# Patient Record
Sex: Female | Born: 1940 | Race: White | Hispanic: No | Smoking: Never smoker
Health system: Southern US, Community
[De-identification: ages and names within clinical notes are randomized; demographics above are authoritative.]

## PROBLEM LIST (undated history)

## (undated) DIAGNOSIS — F32A Depression, unspecified: Secondary | ICD-10-CM

## (undated) DIAGNOSIS — M797 Fibromyalgia: Secondary | ICD-10-CM

## (undated) DIAGNOSIS — T4145XA Adverse effect of unspecified anesthetic, initial encounter: Secondary | ICD-10-CM

## (undated) DIAGNOSIS — K219 Gastro-esophageal reflux disease without esophagitis: Secondary | ICD-10-CM

## (undated) DIAGNOSIS — F329 Major depressive disorder, single episode, unspecified: Secondary | ICD-10-CM

## (undated) DIAGNOSIS — M1990 Unspecified osteoarthritis, unspecified site: Secondary | ICD-10-CM

## (undated) DIAGNOSIS — F419 Anxiety disorder, unspecified: Secondary | ICD-10-CM

## (undated) DIAGNOSIS — T8859XA Other complications of anesthesia, initial encounter: Secondary | ICD-10-CM

## (undated) HISTORY — PX: BACK SURGERY: SHX140

## (undated) HISTORY — PX: ABDOMINAL HYSTERECTOMY: SHX81

---

## 2004-11-01 ENCOUNTER — Ambulatory Visit (HOSPITAL_COMMUNITY): Admission: RE | Admit: 2004-11-01 | Discharge: 2004-11-02 | Payer: Self-pay | Admitting: Neurosurgery

## 2018-11-27 ENCOUNTER — Other Ambulatory Visit: Payer: Self-pay | Admitting: Neurosurgery

## 2018-12-28 NOTE — Pre-Procedure Instructions (Signed)
Alison FullerMargaret Davis  12/28/2018      [facility] [address], [place] - [address], [id number] [place] [id number] Phone: [phone] Fax: [phone]    Your procedure is scheduled on Fri., Jan. 10, 2020 from 9:30AM-12:10PM  [facility] "A" at 7:30AM  Call this number if you have problems the morning of surgery:  [phone]   Remember:  Do not eat or drink after midnight on Jan. 9th    Take these medicines the morning of surgery with A SIP OF WATER: Estradiol (ESTRACE)  If needed: TraMADol (ULTRAM) and Eye drops  As of today, stop taking all Other Aspirin Products, Vitamins, Fish oils, and Herbal medications. Also stop all NSAIDS i.e. Advil, Ibuprofen, Motrin, Aleve, Anaprox, Naproxen, BC, Goody Powders, and all Supplements.    Do not wear jewelry, make-up or nail polish.  Do not wear lotions, powders, or perfumes, or deodorant.  Do not shave 48 hours prior to surgery.    Do not bring valuables to the hospital.  [facility] is not responsible for any belongings or valuables.  Contacts, dentures or bridgework may not be worn into surgery.  Leave your suitcase in the car.  After surgery it may be brought to your room.  For patients admitted to the hospital, discharge time will be determined by your treatment team.  Patients discharged the day of surgery will not be allowed to drive home.   Special instructions:   [facility]- Preparing For Surgery  Before surgery, you can play an important role. Because skin is not sterile, your skin needs to be as free of germs as possible. You can reduce the number of germs on your skin by washing with CHG (chlorahexidine gluconate) Soap before surgery.  CHG is an antiseptic cleaner which kills germs and bonds with the skin to continue killing germs even after washing.    Oral Hygiene is also important to reduce your risk of infection.  Remember - BRUSH  YOUR TEETH THE MORNING OF SURGERY WITH YOUR REGULAR TOOTHPASTE  Please do not use if you have an allergy to CHG or antibacterial soaps. If your skin becomes reddened/irritated stop using the CHG.  Do not shave (including legs and underarms) for at least 48 hours prior to first CHG shower. It is OK to shave your face.  Please follow these instructions carefully.   1. Shower the NIGHT BEFORE SURGERY and the MORNING OF SURGERY with CHG.   2. If you chose to wash your hair, wash your hair first as usual with your normal shampoo.  3. After you shampoo, rinse your hair and body thoroughly to remove the shampoo.  4. Use CHG as you would any other liquid soap. You can apply CHG directly to the skin and wash gently with a scrungie or a clean washcloth.   5. Apply the CHG Soap to your body ONLY FROM THE NECK DOWN.  Do not use on open wounds or open sores. Avoid contact with your eyes, ears, mouth and genitals (private parts). Wash Face and genitals (private parts)  with your normal soap.  6. Wash thoroughly, paying special attention to the area where your surgery will be performed.  7. Thoroughly rinse your body with warm water from the neck down.  8. DO NOT shower/wash with your normal soap after using and rinsing off the CHG Soap.  9. Pat yourself dry with a CLEAN TOWEL.  10. Wear  CLEAN PAJAMAS to bed the night before surgery, wear comfortable clothes the morning of surgery  11. Place CLEAN SHEETS on your bed the night of your first shower and DO NOT SLEEP WITH PETS.  Day of Surgery:  Do not apply any deodorants/lotions.  Please wear clean clothes to the hospital/surgery center.   Remember to brush your teeth WITH YOUR REGULAR TOOTHPASTE.  Please read over the following fact sheets that you were given. Pain Booklet, Coughing and Deep Breathing, MRSA Information and Surgical Site Infection Prevention

## 2018-12-31 ENCOUNTER — Ambulatory Visit (HOSPITAL_COMMUNITY)
Admission: RE | Admit: 2018-12-31 | Discharge: 2018-12-31 | Disposition: A | Discharge status: Home / Self Care | Payer: Medicare Other | Source: Ambulatory Visit | Attending: Anesthesiology | Admitting: Anesthesiology

## 2018-12-31 ENCOUNTER — Other Ambulatory Visit: Payer: Self-pay

## 2018-12-31 ENCOUNTER — Encounter (HOSPITAL_COMMUNITY)
Admission: RE | Admit: 2018-12-31 | Discharge: 2018-12-31 | Disposition: A | Discharge status: Home / Self Care | Payer: Medicare Other | Source: Ambulatory Visit | Attending: Neurosurgery | Admitting: Neurosurgery

## 2018-12-31 ENCOUNTER — Encounter (HOSPITAL_COMMUNITY): Payer: Self-pay

## 2018-12-31 DIAGNOSIS — Z01818 Encounter for other preprocedural examination: Secondary | ICD-10-CM | POA: Insufficient documentation

## 2018-12-31 HISTORY — DX: Gastro-esophageal reflux disease without esophagitis: K21.9

## 2018-12-31 HISTORY — DX: Fibromyalgia: M79.7

## 2018-12-31 HISTORY — DX: Anxiety disorder, unspecified: F41.9

## 2018-12-31 HISTORY — DX: Major depressive disorder, single episode, unspecified: F32.9

## 2018-12-31 HISTORY — DX: Depression, unspecified: F32.A

## 2018-12-31 HISTORY — DX: Unspecified osteoarthritis, unspecified site: M19.90

## 2018-12-31 HISTORY — DX: Other complications of anesthesia, initial encounter: T88.59XA

## 2018-12-31 HISTORY — DX: Adverse effect of unspecified anesthetic, initial encounter: T41.45XA

## 2018-12-31 LAB — BASIC METABOLIC PANEL
Anion gap: 10 (ref 5–15)
BUN: 11 mg/dL (ref 8–23)
CO2: 26 mmol/L (ref 22–32)
Calcium: 9.2 mg/dL (ref 8.9–10.3)
Chloride: 99 mmol/L (ref 98–111)
Creatinine, Ser: 0.84 mg/dL (ref 0.44–1.00)
GFR calc Af Amer: >60 mL/min (ref >60)
GFR calc non Af Amer: >60 mL/min (ref >60)
Glucose, Bld: 104 mg/dL — ABNORMAL HIGH (ref 70–99)
Potassium: 3.6 mmol/L (ref 3.5–5.1)
Sodium: 135 mmol/L (ref 135–145)

## 2018-12-31 LAB — CBC WITH DIFFERENTIAL/PLATELET
Abs Immature Granulocytes: 0.02 10*3/uL (ref 0.00–0.07)
Basophils Absolute: 0.1 10*3/uL (ref 0.0–0.1)
Basophils Relative: 1 %
Eosinophils Absolute: 0.1 10*3/uL (ref 0.0–0.5)
Eosinophils Relative: 1 %
HCT: 41.3 % (ref 36.0–46.0)
Hemoglobin: 12.9 g/dL (ref 12.0–15.0)
Immature Granulocytes: 0 %
Lymphocytes Relative: 29 %
Lymphs Abs: 2.6 10*3/uL (ref 0.7–4.0)
MCH: 29.8 pg (ref 26.0–34.0)
MCHC: 31.2 g/dL (ref 30.0–36.0)
MCV: 95.4 fL (ref 80.0–100.0)
Monocytes Absolute: 0.5 10*3/uL (ref 0.1–1.0)
Monocytes Relative: 6 %
Neutro Abs: 5.8 10*3/uL (ref 1.7–7.7)
Neutrophils Relative %: 63 %
Platelets: 264 10*3/uL (ref 150–400)
RBC: 4.33 MIL/uL (ref 3.87–5.11)
RDW: 12.2 % (ref 11.5–15.5)
WBC: 9.1 10*3/uL (ref 4.0–10.5)
nRBC: 0 % (ref 0.0–0.2)

## 2018-12-31 LAB — SURGICAL PCR SCREEN
MRSA, PCR: NEGATIVE
Staphylococcus aureus: NEGATIVE

## 2018-12-31 LAB — TYPE AND SCREEN
ABO/RH(D): A POS
Antibody Screen: NEGATIVE

## 2018-12-31 LAB — ABO/RH: ABO/RH(D): A POS

## 2018-12-31 NOTE — Progress Notes (Addendum)
PCP - Thayer Jewandice Johnson-Leonard, NP Cardiologist - pt denies  Chest x-ray - patient is having shortness of breath today 12/31/2018 EKG - 12/31/2018 pt denies past year-requested Cardiology scan in care everywhere 11/28/17 and EKG tracing  Stress Test - pt denies ECHO - pt denies  Cardiac Cath - pt denies  Sleep Study - pt denies CPAP - n/a  Fasting Blood Sugar - n/a Checks Blood Sugar _____ times a day -n/a  Blood Thinner Instructions:n/a Aspirin Instructions:n/a  Anesthesia review:  YES-per order. Antionette PolesJames Burns PA-C informed of order and of patient's shortness of breath (she believes it is due to anxiety) and is coming to speak with patient  Patient denies fever, cough and chest pain at PAT appointment  Patient is having some shortness of breath and has been for the past several days.  Chest x-ray and EKG ordered   Patient verbalized understanding of instructions that were given to them at the PAT appointment. Patient was also instructed that they will need to review over the PAT instructions again at home before surgery.

## 2019-01-01 NOTE — Progress Notes (Signed)
Anesthesia Chart Review:  Case:  [id number] Date/Time:  01/04/19 0915   Procedure:  ACD C4-C5 - C5-C6 - C6-C7 (N/A )   Anesthesia type:  General   Pre-op diagnosis:  Stenosis   Location:  [facility] OR ROOM 19 / [facility] OR   Surgeon:  Julio SicksPool, Henry, MD      DISCUSSION: 78 yo female never smoker. Pertinent hx includes GERD, Anxiety, Depression, Fibromyalgia, Complication of anesthesia "short term memory loss for 6wks after previous back surgery ~15 years ago".  I was called to see pt at her PAT appt because she reported feeling some mild dyspnea. She tells me that for the past couple days she feels like she can't get a full breath. She says she doesn't really feel like she is out of breath, but feels like she can't get a deep breath in. She denies any s/s of illness, no fever or cough. She is not limited in her activity, denies any CP or DOE. She has no PND or orthopnea. She has a history of anxiety and says she thinks this is likely the cause. She admits to being worried about her upcoming surgery. She had similar surgery ~15 years ago and reports having some postop short term memory disturbance at that time. Says for about 6 weeks she had to write everything down or she would immediately forget. Did not report this to anyone, was not evaluated at the time. The symptoms resolved and have not reoccurred. We discussed that if this happens again she should report it to her surgeon so that it can be monitored and further evaluated if needed. On exam she is well-appearing, in NAD. Vitals are WNL. Breath sounds are CTAB, heart is RRR. She has no peripheral edema.    CXR was ordered to rule out any pulm etiology for her feeling SOB and it showed no acute process.  Overall her exam is benign and it does sound like this is most likely anxiety mediated. However, I did advise her that if the symptoms change or worsen she should see her PCP for further evaluation. I anticipate she can proceed as planned.  VS: BP 136/62   Pulse  89   Temp 36.7 C (Oral)   Resp 18   Ht 5\' 6"  (1.676 m)   Wt 67.1 kg   SpO2 99%   BMI 23.89 kg/m   PROVIDERS: Johnson-Leonard, Candice, NP is PCP   LABS: Labs reviewed: Acceptable for surgery. (all labs ordered are listed, but only abnormal results are displayed)  Labs Reviewed  BASIC METABOLIC PANEL - Abnormal; Notable for the following components:      Result Value   Glucose, Bld 104 (*)    All other components within normal limits  SURGICAL PCR SCREEN  CBC WITH DIFFERENTIAL/PLATELET  TYPE AND SCREEN  ABO/RH     IMAGES: CHEST - 2 VIEW 12/31/18  COMPARISON:  None.  FINDINGS: Biapical scarring. Heart is normal size. Lungs otherwise clear. No effusions or acute bony abnormality.  IMPRESSION: Biapical scarring.  No active disease.  EKG: 12/31/18: NSR. Rate 88.  CV: N/A   Past Medical History:  Diagnosis Date  . Anxiety   . Arthritis   . Complication of anesthesia    short term memory for about 6 weeks after back surgery  . Depression   . Fibromyalgia   . GERD (gastroesophageal reflux disease)     Past Surgical History:  Procedure Laterality Date  . ABDOMINAL HYSTERECTOMY    . BACK SURGERY  [facility]    MEDICATIONS: . Cholecalciferol (VITAMIN D3) 50 MCG (2000 UT) capsule  . diclofenac sodium (VOLTAREN) 1 % GEL  . estradiol (ESTRACE) 2 MG tablet  . hydroxypropyl methylcellulose / hypromellose (ISOPTO TEARS / GONIOVISC) 2.5 % ophthalmic solution  . Liniments (SALONPAS PAIN RELIEF PATCH EX)  . Multiple Minerals-Vitamins (CALCIUM-MAGNESIUM-ZINC-D3) TABS  . Multiple Vitamin (MULTIVITAMIN WITH MINERALS) TABS tablet  . traMADol (ULTRAM) 50 MG tablet   No current facility-administered medications for this encounter.     Zannie CoveJames Ethie Curless, PA-C [facility] Phone [phone] 01/01/2019 1:30 PM

## 2019-01-01 NOTE — Anesthesia Preprocedure Evaluation (Addendum)
Anesthesia Evaluation  Patient identified by MRN, date of birth, ID band Patient awake    Reviewed: Allergy & Precautions, NPO status , Patient's Chart, lab work & pertinent test results  Airway Mallampati: II  TM Distance: >3 FB Neck ROM: Limited    Dental  (+) Upper Dentures, Lower Dentures   Pulmonary neg pulmonary ROS,    Pulmonary exam normal breath sounds clear to auscultation       Cardiovascular negative cardio ROS Normal cardiovascular exam Rhythm:Regular Rate:Normal  ECG: NSR, rate 88   Neuro/Psych PSYCHIATRIC DISORDERS Anxiety Depression negative neurological ROS     GI/Hepatic Neg liver ROS, GERD  Controlled,  Endo/Other  negative endocrine ROS  Renal/GU negative Renal ROS     Musculoskeletal negative musculoskeletal ROS (+)   Abdominal   Peds  Hematology negative hematology ROS (+)   Anesthesia Other Findings Cervical Stenosis  Reproductive/Obstetrics                            Anesthesia Physical Anesthesia Plan  ASA: II  Anesthesia Plan: General   Post-op Pain Management:    Induction: Intravenous  PONV Risk Score and Plan: 4 or greater and Dexamethasone, Ondansetron and Treatment may vary due to age or medical condition  Airway Management Planned: Oral ETT  Additional Equipment:   Intra-op Plan:   Post-operative Plan: Extubation in OR  Informed Consent: I have reviewed the patients History and Physical, chart, labs and discussed the procedure including the risks, benefits and alternatives for the proposed anesthesia with the patient or authorized representative who has indicated his/her understanding and acceptance.   Dental advisory given  Plan Discussed with: CRNA  Anesthesia Plan Comments: (Reviewed PAT note by Antionette Poles, PA-C )       Anesthesia Quick Evaluation

## 2019-01-04 ENCOUNTER — Other Ambulatory Visit: Payer: Self-pay

## 2019-01-04 ENCOUNTER — Inpatient Hospital Stay (HOSPITAL_COMMUNITY): Payer: Medicare Other | Admitting: Physician Assistant

## 2019-01-04 ENCOUNTER — Inpatient Hospital Stay (HOSPITAL_COMMUNITY): Payer: Medicare Other | Admitting: Certified Registered Nurse Anesthetist

## 2019-01-04 ENCOUNTER — Inpatient Hospital Stay (HOSPITAL_COMMUNITY)
Admission: RE | Admit: 2019-01-04 | Discharge: 2019-01-05 | DRG: 473 | Disposition: A | Discharge status: Home / Self Care | Payer: Medicare Other | Attending: Neurosurgery | Admitting: Neurosurgery

## 2019-01-04 ENCOUNTER — Encounter (HOSPITAL_COMMUNITY)
Admission: RE | Disposition: A | Discharge status: Home / Self Care | Payer: Self-pay | Source: Home / Self Care | Attending: Neurosurgery

## 2019-01-04 ENCOUNTER — Encounter (HOSPITAL_COMMUNITY): Payer: Self-pay

## 2019-01-04 ENCOUNTER — Inpatient Hospital Stay (HOSPITAL_COMMUNITY): Payer: Medicare Other

## 2019-01-04 DIAGNOSIS — Z419 Encounter for procedure for purposes other than remedying health state, unspecified: Secondary | ICD-10-CM

## 2019-01-04 DIAGNOSIS — M797 Fibromyalgia: Secondary | ICD-10-CM | POA: Diagnosis present

## 2019-01-04 DIAGNOSIS — M1990 Unspecified osteoarthritis, unspecified site: Secondary | ICD-10-CM | POA: Diagnosis present

## 2019-01-04 DIAGNOSIS — M4722 Other spondylosis with radiculopathy, cervical region: Secondary | ICD-10-CM | POA: Diagnosis present

## 2019-01-04 DIAGNOSIS — Z79899 Other long term (current) drug therapy: Secondary | ICD-10-CM

## 2019-01-04 DIAGNOSIS — F329 Major depressive disorder, single episode, unspecified: Secondary | ICD-10-CM | POA: Diagnosis present

## 2019-01-04 DIAGNOSIS — M4802 Spinal stenosis, cervical region: Secondary | ICD-10-CM | POA: Diagnosis present

## 2019-01-04 DIAGNOSIS — Z90710 Acquired absence of both cervix and uterus: Secondary | ICD-10-CM | POA: Diagnosis not present

## 2019-01-04 DIAGNOSIS — M2578 Osteophyte, vertebrae: Secondary | ICD-10-CM | POA: Diagnosis present

## 2019-01-04 DIAGNOSIS — Z79891 Long term (current) use of opiate analgesic: Secondary | ICD-10-CM

## 2019-01-04 DIAGNOSIS — K219 Gastro-esophageal reflux disease without esophagitis: Secondary | ICD-10-CM | POA: Diagnosis present

## 2019-01-04 DIAGNOSIS — M5412 Radiculopathy, cervical region: Secondary | ICD-10-CM | POA: Diagnosis present

## 2019-01-04 DIAGNOSIS — F419 Anxiety disorder, unspecified: Secondary | ICD-10-CM | POA: Diagnosis present

## 2019-01-04 HISTORY — PX: ANTERIOR CERVICAL DECOMP/DISCECTOMY FUSION: SHX1161

## 2019-01-04 SURGERY — ANTERIOR CERVICAL DECOMPRESSION/DISCECTOMY FUSION 3 LEVELS
Anesthesia: General | Site: Spine Cervical

## 2019-01-04 MED ORDER — TRAMADOL HCL 50 MG PO TABS: 50.0000 mg | ORAL_TABLET | Freq: Four times a day (QID) | ORAL | Status: DC | PRN

## 2019-01-04 MED ORDER — EPHEDRINE SULFATE 50 MG/ML IJ SOLN
INTRAMUSCULAR | Status: DC | PRN
  Administered 2019-01-04: 5 mg via INTRAVENOUS

## 2019-01-04 MED ORDER — ROCURONIUM BROMIDE 50 MG/5ML IV SOSY
PREFILLED_SYRINGE | INTRAVENOUS | Status: AC
  Filled 2019-01-04: qty 5

## 2019-01-04 MED ORDER — DEXAMETHASONE SODIUM PHOSPHATE 10 MG/ML IJ SOLN
INTRAMUSCULAR | Status: AC
  Filled 2019-01-04: qty 2

## 2019-01-04 MED ORDER — 0.9 % SODIUM CHLORIDE (POUR BTL) OPTIME
TOPICAL | Status: DC | PRN
  Administered 2019-01-04: 1000 mL

## 2019-01-04 MED ORDER — HYDROCODONE-ACETAMINOPHEN 5-325 MG PO TABS: 1.0000 | ORAL_TABLET | ORAL | Status: DC | PRN

## 2019-01-04 MED ORDER — SUGAMMADEX SODIUM 200 MG/2ML IV SOLN
INTRAVENOUS | Status: DC | PRN
  Administered 2019-01-04: 125 mg via INTRAVENOUS

## 2019-01-04 MED ORDER — CEFAZOLIN SODIUM-DEXTROSE 1-4 GM/50ML-% IV SOLN
1.0000 g | Freq: Three times a day (TID) | INTRAVENOUS | Status: AC
  Administered 2019-01-04 – 2019-01-05 (×2): 1 g via INTRAVENOUS
  Filled 2019-01-04 (×2): qty 50

## 2019-01-04 MED ORDER — PROPOFOL 10 MG/ML IV BOLUS
INTRAVENOUS | Status: AC
  Filled 2019-01-04: qty 20

## 2019-01-04 MED ORDER — FENTANYL CITRATE (PF) 100 MCG/2ML IJ SOLN
INTRAMUSCULAR | Status: AC
  Administered 2019-01-04: 50 ug via INTRAVENOUS
  Filled 2019-01-04: qty 2

## 2019-01-04 MED ORDER — CYCLOBENZAPRINE HCL 10 MG PO TABS
10.0000 mg | ORAL_TABLET | Freq: Three times a day (TID) | ORAL | Status: DC | PRN
  Administered 2019-01-04 – 2019-01-05 (×3): 10 mg via ORAL
  Filled 2019-01-04 (×3): qty 1

## 2019-01-04 MED ORDER — CEFAZOLIN SODIUM-DEXTROSE 2-4 GM/100ML-% IV SOLN
2.0000 g | INTRAVENOUS | Status: AC
  Administered 2019-01-04: 2 g via INTRAVENOUS
  Filled 2019-01-04: qty 100

## 2019-01-04 MED ORDER — ONDANSETRON HCL 4 MG/2ML IJ SOLN: 4.0000 mg | Freq: Four times a day (QID) | INTRAMUSCULAR | Status: DC | PRN

## 2019-01-04 MED ORDER — SODIUM CHLORIDE 0.9 % IV SOLN
INTRAVENOUS | Status: DC | PRN
  Administered 2019-01-04: 500 mL

## 2019-01-04 MED ORDER — POLYVINYL ALCOHOL 1.4 % OP SOLN
1.0000 [drp] | Freq: Three times a day (TID) | OPHTHALMIC | Status: DC | PRN
  Filled 2019-01-04: qty 15

## 2019-01-04 MED ORDER — MAGNESIUM OXIDE 400 (241.3 MG) MG PO TABS
200.0000 mg | ORAL_TABLET | Freq: Every day | ORAL | Status: DC
  Administered 2019-01-04: 200 mg via ORAL
  Filled 2019-01-04: qty 1

## 2019-01-04 MED ORDER — ONDANSETRON HCL 4 MG/2ML IJ SOLN
INTRAMUSCULAR | Status: DC | PRN
  Administered 2019-01-04: 4 mg via INTRAVENOUS

## 2019-01-04 MED ORDER — PHENOL 1.4 % MT LIQD: 1.0000 | OROMUCOSAL | Status: DC | PRN

## 2019-01-04 MED ORDER — SODIUM CHLORIDE 0.9% FLUSH: 3.0000 mL | Freq: Two times a day (BID) | INTRAVENOUS | Status: DC

## 2019-01-04 MED ORDER — ACETAMINOPHEN 325 MG PO TABS: 650.0000 mg | ORAL_TABLET | ORAL | Status: DC | PRN

## 2019-01-04 MED ORDER — ESTRADIOL 2 MG PO TABS
2.0000 mg | ORAL_TABLET | Freq: Every day | ORAL | Status: DC
  Filled 2019-01-04: qty 1

## 2019-01-04 MED ORDER — THROMBIN 20000 UNITS EX SOLR
CUTANEOUS | Status: AC
  Filled 2019-01-04: qty 20000

## 2019-01-04 MED ORDER — CALCIUM CARBONATE-VITAMIN D 500-200 MG-UNIT PO TABS
1.0000 | ORAL_TABLET | Freq: Every day | ORAL | Status: DC
  Administered 2019-01-04: 1 via ORAL
  Filled 2019-01-04: qty 1

## 2019-01-04 MED ORDER — ROCURONIUM BROMIDE 100 MG/10ML IV SOLN
INTRAVENOUS | Status: DC | PRN
  Administered 2019-01-04: 30 mg via INTRAVENOUS
  Administered 2019-01-04 (×2): 20 mg via INTRAVENOUS

## 2019-01-04 MED ORDER — ONDANSETRON HCL 4 MG/2ML IJ SOLN
INTRAMUSCULAR | Status: AC
  Filled 2019-01-04: qty 2

## 2019-01-04 MED ORDER — DEXAMETHASONE SODIUM PHOSPHATE 10 MG/ML IJ SOLN
10.0000 mg | INTRAMUSCULAR | Status: DC
  Filled 2019-01-04: qty 1

## 2019-01-04 MED ORDER — CHLORHEXIDINE GLUCONATE CLOTH 2 % EX PADS: 6.0000 | MEDICATED_PAD | Freq: Once | CUTANEOUS | Status: DC

## 2019-01-04 MED ORDER — SODIUM CHLORIDE 0.9 % IV SOLN: 250.0000 mL | INTRAVENOUS | Status: DC

## 2019-01-04 MED ORDER — VITAMIN D 25 MCG (1000 UNIT) PO TABS
4000.0000 [IU] | ORAL_TABLET | Freq: Every day | ORAL | Status: DC
  Administered 2019-01-04: 4000 [IU] via ORAL
  Filled 2019-01-04: qty 4

## 2019-01-04 MED ORDER — EPHEDRINE 5 MG/ML INJ
INTRAVENOUS | Status: AC
  Filled 2019-01-04: qty 10

## 2019-01-04 MED ORDER — THROMBIN 5000 UNITS EX SOLR
CUTANEOUS | Status: AC
  Filled 2019-01-04: qty 5000

## 2019-01-04 MED ORDER — HYDROCODONE-ACETAMINOPHEN 10-325 MG PO TABS
2.0000 | ORAL_TABLET | ORAL | Status: DC | PRN
  Administered 2019-01-04 – 2019-01-05 (×4): 2 via ORAL
  Filled 2019-01-04 (×5): qty 2

## 2019-01-04 MED ORDER — FENTANYL CITRATE (PF) 250 MCG/5ML IJ SOLN
INTRAMUSCULAR | Status: DC | PRN
  Administered 2019-01-04: 50 ug via INTRAVENOUS
  Administered 2019-01-04: 25 ug via INTRAVENOUS
  Administered 2019-01-04: 50 ug via INTRAVENOUS
  Administered 2019-01-04: 100 ug via INTRAVENOUS
  Administered 2019-01-04: 25 ug via INTRAVENOUS

## 2019-01-04 MED ORDER — LACTATED RINGERS IV SOLN
INTRAVENOUS | Status: DC
  Administered 2019-01-04 (×2): via INTRAVENOUS

## 2019-01-04 MED ORDER — CALCIUM-MAGNESIUM-ZINC-D3 PO TABS: 1.0000 | ORAL_TABLET | Freq: Every day | ORAL | Status: DC

## 2019-01-04 MED ORDER — ACETAMINOPHEN 650 MG RE SUPP: 650.0000 mg | RECTAL | Status: DC | PRN

## 2019-01-04 MED ORDER — FENTANYL CITRATE (PF) 250 MCG/5ML IJ SOLN
INTRAMUSCULAR | Status: AC
  Filled 2019-01-04: qty 5

## 2019-01-04 MED ORDER — FENTANYL CITRATE (PF) 100 MCG/2ML IJ SOLN
25.0000 ug | INTRAMUSCULAR | Status: DC | PRN
  Administered 2019-01-04: 50 ug via INTRAVENOUS

## 2019-01-04 MED ORDER — ONDANSETRON HCL 4 MG PO TABS: 4.0000 mg | ORAL_TABLET | Freq: Four times a day (QID) | ORAL | Status: DC | PRN

## 2019-01-04 MED ORDER — THROMBIN 5000 UNITS EX SOLR
OROMUCOSAL | Status: DC | PRN
  Administered 2019-01-04: 5 mL via TOPICAL

## 2019-01-04 MED ORDER — HYDROMORPHONE HCL 1 MG/ML IJ SOLN: 1.0000 mg | INTRAMUSCULAR | Status: DC | PRN

## 2019-01-04 MED ORDER — MENTHOL 3 MG MT LOZG
1.0000 | LOZENGE | OROMUCOSAL | Status: DC | PRN
  Filled 2019-01-04: qty 9

## 2019-01-04 MED ORDER — THROMBIN 20000 UNITS EX SOLR
CUTANEOUS | Status: DC | PRN
  Administered 2019-01-04: 20 mL via TOPICAL

## 2019-01-04 MED ORDER — PHENYLEPHRINE 40 MCG/ML (10ML) SYRINGE FOR IV PUSH (FOR BLOOD PRESSURE SUPPORT)
PREFILLED_SYRINGE | INTRAVENOUS | Status: AC
  Filled 2019-01-04: qty 10

## 2019-01-04 MED ORDER — DEXAMETHASONE SODIUM PHOSPHATE 10 MG/ML IJ SOLN
INTRAMUSCULAR | Status: DC | PRN
  Administered 2019-01-04: 10 mg via INTRAVENOUS

## 2019-01-04 MED ORDER — LIDOCAINE 2% (20 MG/ML) 5 ML SYRINGE
INTRAMUSCULAR | Status: AC
  Filled 2019-01-04: qty 5

## 2019-01-04 MED ORDER — LIDOCAINE HCL (CARDIAC) PF 100 MG/5ML IV SOSY
PREFILLED_SYRINGE | INTRAVENOUS | Status: DC | PRN
  Administered 2019-01-04: 40 mg via INTRAVENOUS

## 2019-01-04 MED ORDER — ALBUMIN HUMAN 5 % IV SOLN
INTRAVENOUS | Status: DC | PRN
  Administered 2019-01-04: 12:00:00 via INTRAVENOUS

## 2019-01-04 MED ORDER — SODIUM CHLORIDE 0.9% FLUSH: 3.0000 mL | INTRAVENOUS | Status: DC | PRN

## 2019-01-04 MED ORDER — ONDANSETRON HCL 4 MG/2ML IJ SOLN: 4.0000 mg | Freq: Once | INTRAMUSCULAR | Status: DC | PRN

## 2019-01-04 MED ORDER — PROPOFOL 10 MG/ML IV BOLUS
INTRAVENOUS | Status: DC | PRN
  Administered 2019-01-04: 130 mg via INTRAVENOUS

## 2019-01-04 MED ORDER — ZINC SULFATE 220 (50 ZN) MG PO CAPS
220.0000 mg | ORAL_CAPSULE | Freq: Every day | ORAL | Status: DC
  Administered 2019-01-04: 220 mg via ORAL
  Filled 2019-01-04 (×2): qty 1

## 2019-01-04 MED ORDER — ADULT MULTIVITAMIN W/MINERALS CH
2.0000 | ORAL_TABLET | Freq: Every day | ORAL | Status: DC
  Administered 2019-01-04: 2 via ORAL
  Filled 2019-01-04: qty 2

## 2019-01-04 MED ORDER — SUCCINYLCHOLINE CHLORIDE 200 MG/10ML IV SOSY
PREFILLED_SYRINGE | INTRAVENOUS | Status: AC
  Filled 2019-01-04: qty 10

## 2019-01-04 SURGICAL SUPPLY — 71 items
APL SKNCLS STERI-STRIP NONHPOA (GAUZE/BANDAGES/DRESSINGS) ×1
BAG DECANTER FOR FLEXI CONT (MISCELLANEOUS) ×3 IMPLANT
BENZOIN TINCTURE PRP APPL 2/3 (GAUZE/BANDAGES/DRESSINGS) ×3 IMPLANT
BIT DRILL 13 (BIT) ×1 IMPLANT
BIT DRILL 13MM (BIT) ×1
BUR MATCHSTICK NEURO 3.0 LAGG (BURR) ×3 IMPLANT
CAGE PEEK 6X14X11 (Cage) ×3 IMPLANT
CAGE PEEK 7X14X11 (Cage) ×3 IMPLANT
CANISTER SUCT 3000ML PPV (MISCELLANEOUS) ×3 IMPLANT
CARTRIDGE OIL MAESTRO DRILL (MISCELLANEOUS) ×1 IMPLANT
CLOSURE STERI-STRIP 1/2X4 (GAUZE/BANDAGES/DRESSINGS) ×1
CLOSURE WOUND 1/2 X4 (GAUZE/BANDAGES/DRESSINGS) ×1
CLSR STERI-STRIP ANTIMIC 1/2X4 (GAUZE/BANDAGES/DRESSINGS) ×1 IMPLANT
COVER WAND RF STERILE (DRAPES) ×3 IMPLANT
DIFFUSER DRILL AIR PNEUMATIC (MISCELLANEOUS) ×3 IMPLANT
DRAPE C-ARM 42X72 X-RAY (DRAPES) ×6 IMPLANT
DRAPE LAPAROTOMY 100X72 PEDS (DRAPES) ×3 IMPLANT
DRAPE MICROSCOPE LEICA (MISCELLANEOUS) ×3 IMPLANT
DURAPREP 6ML APPLICATOR 50/CS (WOUND CARE) ×3 IMPLANT
ELECT COATED BLADE 2.86 ST (ELECTRODE) ×3 IMPLANT
ELECT REM PT RETURN 9FT ADLT (ELECTROSURGICAL) ×3
ELECTRODE REM PT RTRN 9FT ADLT (ELECTROSURGICAL) ×1 IMPLANT
GAUZE 4X4 16PLY RFD (DISPOSABLE) IMPLANT
GAUZE SPONGE 4X4 12PLY STRL (GAUZE/BANDAGES/DRESSINGS) ×1 IMPLANT
GAUZE SPONGE 4X4 12PLY STRL LF (GAUZE/BANDAGES/DRESSINGS) ×2 IMPLANT
GLOVE BIO SURGEON STRL SZ 6.5 (GLOVE) ×5 IMPLANT
GLOVE BIO SURGEONS STRL SZ 6.5 (GLOVE) ×5
GLOVE BIOGEL PI IND STRL 6.5 (GLOVE) IMPLANT
GLOVE BIOGEL PI IND STRL 7.0 (GLOVE) IMPLANT
GLOVE BIOGEL PI INDICATOR 6.5 (GLOVE) ×8
GLOVE BIOGEL PI INDICATOR 7.0 (GLOVE) ×4
GLOVE ECLIPSE 9.0 STRL (GLOVE) ×3 IMPLANT
GLOVE EXAM NITRILE XL STR (GLOVE) IMPLANT
GLOVE SURG SS PI 6.0 STRL IVOR (GLOVE) ×4 IMPLANT
GOWN STRL REUS W/ TWL LRG LVL3 (GOWN DISPOSABLE) IMPLANT
GOWN STRL REUS W/ TWL XL LVL3 (GOWN DISPOSABLE) IMPLANT
GOWN STRL REUS W/TWL 2XL LVL3 (GOWN DISPOSABLE) IMPLANT
GOWN STRL REUS W/TWL LRG LVL3 (GOWN DISPOSABLE) ×15
GOWN STRL REUS W/TWL XL LVL3 (GOWN DISPOSABLE) ×3
HALTER HD/CHIN CERV TRACTION D (MISCELLANEOUS) ×3 IMPLANT
HEMOSTAT POWDER KIT SURGIFOAM (HEMOSTASIS) ×3 IMPLANT
KIT BASIN OR (CUSTOM PROCEDURE TRAY) ×3 IMPLANT
KIT TURNOVER KIT B (KITS) ×3 IMPLANT
NDL SPNL 20GX3.5 QUINCKE YW (NEEDLE) ×1 IMPLANT
NEEDLE SPNL 20GX3.5 QUINCKE YW (NEEDLE) ×3 IMPLANT
NS IRRIG 1000ML POUR BTL (IV SOLUTION) ×3 IMPLANT
OIL CARTRIDGE MAESTRO DRILL (MISCELLANEOUS) ×3
PACK LAMINECTOMY NEURO (CUSTOM PROCEDURE TRAY) ×3 IMPLANT
PAD ARMBOARD 7.5X6 YLW CONV (MISCELLANEOUS) ×9 IMPLANT
PEEK CAGE 8X14X11 (Peek) ×2 IMPLANT
PLATE 3 62.5XLCK NS SPNE CVD (Plate) IMPLANT
PLATE 3 ATLANTIS TRANS (Plate) ×3 IMPLANT
RUBBERBAND STERILE (MISCELLANEOUS) ×6 IMPLANT
SCREW ST FIX 4 ATL 3120213 (Screw) ×16 IMPLANT
SPACER SPNL 11X14X6XPEEK CVD (Cage) IMPLANT
SPACER SPNL 11X14X7XPEEK CVD (Cage) IMPLANT
SPCR SPNL 11X14X6XPEEK CVD (Cage) ×1 IMPLANT
SPCR SPNL 11X14X7XPEEK CVD (Cage) ×1 IMPLANT
SPONGE INTESTINAL PEANUT (DISPOSABLE) ×3 IMPLANT
SPONGE SURGIFOAM ABS GEL 100 (HEMOSTASIS) ×3 IMPLANT
STRIP CLOSURE SKIN 1/2X4 (GAUZE/BANDAGES/DRESSINGS) ×2 IMPLANT
SUT VIC AB 3-0 SH 8-18 (SUTURE) ×3 IMPLANT
SUT VIC AB 4-0 RB1 18 (SUTURE) ×3 IMPLANT
TAPE CLOTH 4X10 WHT NS (GAUZE/BANDAGES/DRESSINGS) ×3 IMPLANT
TAPE CLOTH SURG 4X10 WHT LF (GAUZE/BANDAGES/DRESSINGS) ×2 IMPLANT
TOWEL GREEN STERILE (TOWEL DISPOSABLE) ×3 IMPLANT
TOWEL GREEN STERILE FF (TOWEL DISPOSABLE) ×3 IMPLANT
TRAP SPECIMEN MUCOUS 40CC (MISCELLANEOUS) ×3 IMPLANT
TUBE CONNECTING 20'X1/4 (TUBING) ×1
TUBE CONNECTING 20X1/4 (TUBING) ×1 IMPLANT
WATER STERILE IRR 1000ML POUR (IV SOLUTION) ×3 IMPLANT

## 2019-01-04 NOTE — Transfer of Care (Signed)
Immediate Anesthesia Transfer of Care Note  Patient: Alison Davis  Procedure(s) Performed: ANTERIOR CERVICAL DECOMPRESSION FUSION CERVICAL FOUR-CERVICAL FIVE - CERVICAL FIVE-CERVICAL SIX - CERVICAL SIX-CERVICAL SEVEN (N/A Spine Cervical)  Patient Location: PACU  Anesthesia Type:General  Level of Consciousness: awake, alert , oriented and patient cooperative  Airway & Oxygen Therapy: Patient Spontanous Breathing  Post-op Assessment: Report given to RN and Post -op Vital signs reviewed and stable  Post vital signs: Reviewed and stable  Last Vitals:  Vitals Value Taken Time  BP 161/84 01/04/2019 12:33 PM  Temp    Pulse 101 01/04/2019 12:36 PM  Resp 22 01/04/2019 12:36 PM  SpO2 99 % 01/04/2019 12:36 PM  Vitals shown include unvalidated device data.  Last Pain:  Vitals:   01/04/19 0828  TempSrc:   PainSc: 8       Patients Stated Pain Goal: 3 (01/04/19 7209)  Complications: No apparent anesthesia complications

## 2019-01-04 NOTE — Anesthesia Postprocedure Evaluation (Signed)
Anesthesia Post Note  Patient: Alison Davis  Procedure(s) Performed: ANTERIOR CERVICAL DECOMPRESSION FUSION CERVICAL FOUR-CERVICAL FIVE - CERVICAL FIVE-CERVICAL SIX - CERVICAL SIX-CERVICAL SEVEN (N/A Spine Cervical)     Patient location during evaluation: PACU Anesthesia Type: General Level of consciousness: awake and alert Pain management: pain level controlled Vital Signs Assessment: post-procedure vital signs reviewed and stable Respiratory status: spontaneous breathing, nonlabored ventilation, respiratory function stable and patient connected to nasal cannula oxygen Cardiovascular status: blood pressure returned to baseline and stable Postop Assessment: no apparent nausea or vomiting Anesthetic complications: no    Last Vitals:  Vitals:   01/04/19 1404 01/04/19 1641  BP: (!) 155/71 (!) 132/115  Pulse: 91 (!) 101  Resp: 17 18  Temp: 36.8 C 36.6 C  SpO2: 98% 98%    Last Pain:  Vitals:   01/04/19 1641  TempSrc: Oral  PainSc:                  Ryan P Ellender

## 2019-01-04 NOTE — Anesthesia Procedure Notes (Signed)
Procedure Name: Intubation Date/Time: 01/04/2019 10:02 AM Performed by: Adonis Housekeeper, CRNA Pre-anesthesia Checklist: Patient identified, Emergency Drugs available, Suction available and Patient being monitored Patient Re-evaluated:Patient Re-evaluated prior to induction Oxygen Delivery Method: Circle system utilized Preoxygenation: Pre-oxygenation with 100% oxygen Induction Type: IV induction Ventilation: Mask ventilation without difficulty Laryngoscope Size: Glidescope and 3 Grade View: Grade I Tube type: Oral Tube size: 7.0 mm Number of attempts: 1 Airway Equipment and Method: Stylet Placement Confirmation: ETT inserted through vocal cords under direct vision,  positive ETCO2 and breath sounds checked- equal and bilateral Secured at: 20 cm Tube secured with: Tape Dental Injury: Teeth and Oropharynx as per pre-operative assessment  Comments: Elective glidescope for ACDF

## 2019-01-04 NOTE — Progress Notes (Signed)
Orthopedic Tech Progress Note Patient Details:  Alison Davis 06-07-1941 [id number]  Patient ID: Alison Davis, female   DOB: 07/02/1941, 78 y.o.   MRN: [id number] rn says pt already has soft collar  Trinna Post 01/04/2019, 2:49 PM

## 2019-01-04 NOTE — H&P (Signed)
  Alison Davis is an 78 y.o. female.   Chief Complaint: Pain HPI: 78 year old female with persistent and worsening neck and bilateral upper extremity pain.  Patient with intermittent associated numbness and paresthesias radiating to her upper extremities.  No definite weakness.  No lower extremity symptoms.  Patient is failed conservative management.  Work-up demonstrates evidence of significant spondylosis with foraminal stenosis at C4-5, C5-6 and C6-7.  Patient presents now for 3 level anterior cervical decompression and fusion in hopes of improving her symptoms.3  Past Medical History:  Diagnosis Date  . Anxiety   . Arthritis   . Complication of anesthesia    short term memory for about 6 weeks after back surgery  . Depression   . Fibromyalgia   . GERD (gastroesophageal reflux disease)     Past Surgical History:  Procedure Laterality Date  . ABDOMINAL HYSTERECTOMY    . BACK SURGERY     2000 at [facility]    History reviewed. No pertinent family history. Social History:  reports that she has never smoked. She has never used smokeless tobacco. She reports previous alcohol use. She reports previous drug use.  Allergies: No Known Allergies  Medications Prior to Admission  Medication Sig Dispense Refill  . Cholecalciferol (VITAMIN D3) 50 MCG (2000 UT) capsule Take 4,000 Units by mouth daily.    . diclofenac sodium (VOLTAREN) 1 % GEL Apply 2 g topically daily as needed (pain).    Marland Kitchen estradiol (ESTRACE) 2 MG tablet Take 2 mg by mouth daily.    . hydroxypropyl methylcellulose / hypromellose (ISOPTO TEARS / GONIOVISC) 2.5 % ophthalmic solution Place 1 drop into both eyes 3 (three) times daily as needed for dry eyes.    . Liniments (SALONPAS PAIN RELIEF PATCH EX) Place 1 patch onto the skin daily as needed (pain).    . Multiple Minerals-Vitamins (CALCIUM-MAGNESIUM-ZINC-D3) TABS Take 1 tablet by mouth daily.    . Multiple Vitamin (MULTIVITAMIN WITH MINERALS) TABS tablet Take 2 tablets  by mouth daily.    . traMADol (ULTRAM) 50 MG tablet Take 50 mg by mouth every 6 (six) hours as needed for moderate pain.      No results found for this or any previous visit (from the past 48 hour(s)). No results found.  Pertinent items noted in HPI and remainder of comprehensive ROS otherwise negative.  Blood pressure (!) 150/67, pulse 75, temperature 98.7 F (37.1 C), temperature source Oral, resp. rate 20, height 5\' 6"  (1.676 m), weight 67.1 kg, SpO2 100 %.  Patient is awake and alert.  She is oriented and appropriate.  Cranial nerve function intact.  Motor and sensory function of her extremities reveals some mild weakness in her grips bilaterally.  She has decreased sensation pinprick and light touch in her C6 and C7 dermatomes bilateral.  Deep tender effusions are normal active.  No evidence of long track signs.  Examination head ears eyes nose throat is unremarkable her chest and abdomen are benign.  Extremities are free from injury or deformity. 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  Plan C4-5, C5-6, C6-7 anterior cervical discectomy with interbody fusion utilizing interbody peek cages, local harvested autograft, and anterior plate instrumentation.  Risks and benefits of been explained.  Patient wishes to proceed.  Kathaleen Maser Beckie Viscardi 01/04/2019, 9:34 AM

## 2019-01-04 NOTE — Brief Op Note (Signed)
01/04/2019  12:19 PM  PATIENT:  Tarri Fuller  78 y.o. female  PRE-OPERATIVE DIAGNOSIS:  Stenosis  POST-OPERATIVE DIAGNOSIS:  Stenosis  PROCEDURE:  Procedure(s): ANTERIOR CERVICAL DECOMPRESSION FUSION CERVICAL FOUR-CERVICAL FIVE - CERVICAL FIVE-CERVICAL SIX - CERVICAL SIX-CERVICAL SEVEN (N/A)  SURGEON:  Surgeon(s) and Role:    * Julio Sicks, MD - Primary  PHYSICIAN ASSISTANT:   ASSISTANTSDoran Durand, NP   ANESTHESIA:   general  EBL:  200 mL   BLOOD ADMINISTERED:none  DRAINS: none   LOCAL MEDICATIONS USED:  NONE  SPECIMEN:  No Specimen  DISPOSITION OF SPECIMEN:  N/A  COUNTS:  YES  TOURNIQUET:  * No tourniquets in log *  DICTATION: .Dragon Dictation  PLAN OF CARE: Admit to inpatient   PATIENT DISPOSITION:  PACU - hemodynamically stable.   Delay start of Pharmacological VTE agent (>24hrs) due to surgical blood loss or risk of bleeding: yes

## 2019-01-04 NOTE — Op Note (Signed)
Date of procedure: 01/04/2019  Date of dictation: Same  Service: Neurosurgery  Preoperative diagnosis: C4-5, C5-6, C6-7 spondylosis with stenosis and radiculopathy  Postoperative diagnosis: Same  Procedure Name: C4-5, C5-6, C6-7 anterior cervical discectomy with interbody fusion utilizing interbody peek cage, local harvested autograft, and anterior plate instrumentation  Surgeon:Karlton Maya A.Kairon Shock, M.D.  Asst. Surgeon: Doran Durand, NP  Anesthesia: General  Indication: 78 year old female with progressive neck and bilateral upper extremity pain failing conservative management work-up demonstrates evidence of marked cervical disc generation with associated spondylosis and stenosis including spinal cord and severe nerve root compression.  Patient presents now for 3 level anterior cervical decompression and fusion in hopes of improving her symptoms.  Operative note: After induction of anesthesia, patient position supine with neck slightly extended and held placed halter traction.  Patient's anterior cervical region prepped draped sterilely.  Incision made overlying C5-6.  Dissection performed on the right.  Retractor placed.  Fluoroscopy used.  Levels confirmed.  Disc spaces incised at all 3 levels.  Discectomy was performed using various instruments down to level the posterior annulus.  Microscope then brought to the field used throughout the remainder of the discectomy surgery many aspects annulus and osteophytes removed using high-speed drill down to level the posterior logical ligament posterior logical is not elevated and resected piecemeal fashion underlying thecal sac was then identified.  Wide central decompression was then performed undercutting the bodies of C4 and C5.  Decompression then proceeded into each neural foramen.  Wide anterior foraminotomies were performed on the course exiting C5 nerve roots bilaterally.  At this point a very thorough decompression had been achieved.  There was no evidence  of injury to the thecal sac or nerve roots.  Procedure then repeated at C5-6 and C6-7 again without complications.  Wounds were then irrigated with antibiotic solution.  Gelfoam was placed topically for hemostasis then removed.  [employer]tronic anatomic peek cages packed with locally harvested autograft were then impacted into place at all 3 levels.  All cages were recessed slightly from the anterior cortical margin.  Medtronic Atlantis translational plate was then placed over the C4, C5, C6, C7 levels.  This then attached under fluoroscopic guidance using 13 mm fixed angle screws to each at all 4 levels.  All 8 screws given final tightening found to be solidly within the bone.  Locking screws and engaged in all levels.  Final images reveal good position of the cages and the hardware at the proper operative level with normal alignment of spine.  Wound is then irrigated one final time.  Hemostasis was assured.  Wounds and closed in layers with Vicryl sutures.  Steri-Strips and sterile dressing were applied.  No apparent complications.  Patient tolerated the procedure well and she returns to the recovery room postop.

## 2019-01-05 MED ORDER — HYDROXYZINE HCL 50 MG/ML IM SOLN
50.0000 mg | Freq: Once | INTRAMUSCULAR | Status: AC
  Administered 2019-01-05: 50 mg via INTRAMUSCULAR
  Filled 2019-01-05: qty 1

## 2019-01-05 MED ORDER — HYDROCODONE-ACETAMINOPHEN 10-325 MG PO TABS: 1.0000 | ORAL_TABLET | ORAL | 0 refills | Status: AC | PRN

## 2019-01-05 MED ORDER — CYCLOBENZAPRINE HCL 10 MG PO TABS: 10.0000 mg | ORAL_TABLET | Freq: Three times a day (TID) | ORAL | 0 refills | Status: AC | PRN

## 2019-01-05 NOTE — Evaluation (Addendum)
Occupational Therapy Evaluation and Discharge Patient Details Name: Alison FullerMargaret Davis MRN: [id number] DOB: 07/01/1941 Today's Date: 01/05/2019    History of Present Illness Pt is a 78 y/o female s/p C4-5, C5-6, C6-7 ACDF.  PMH: anxiety, arthritis, depression, fibromyalgia, back surgery.    Clinical Impression   PTA patient independent and driving.  Admitted for above and limited by soreness and precautions.  She repots slight numbness in R hand, but improved since surgery. Patient able to complete UB ADLs with supervision, LB ADls with setup assist, toilet transfers with supervision, simulated tub transfers with supervision, and short distance functional mobility with supervision.  Patient educated on precautions, ADL compensatory techniques, brace mgmt and wear schedule, posture/positioning, safety and DME/recommendations.  Patient reports she will have initial 24/7 supervision, and agreeable to supervision for shower transfers.  Verbalizes understanding with all education and no further OT needs have been identified at this point.  Thank you for this referral. OT signing off.     Follow Up Recommendations  No OT follow up;Supervision - Intermittent    Equipment Recommendations  None recommended by OT    Recommendations for Other Services       Precautions / Restrictions Precautions Precautions: Cervical Precaution Booklet Issued: Yes (comment) Precaution Comments: reviewed precautions with patient  Required Braces or Orthoses: Cervical Brace Cervical Brace: Soft collar;At all times Restrictions Weight Bearing Restrictions: No      Mobility Bed Mobility Overal bed mobility: Needs Assistance Bed Mobility: Rolling;Sidelying to Sit Rolling: Supervision Sidelying to sit: Supervision       General bed mobility comments: supervision for correct log rolling technique  Transfers Overall transfer level: Needs assistance Equipment used: None Transfers: Sit to/from Stand Sit to  Stand: Supervision         General transfer comment: supervision for safety, demosntrates good techniques and body mechanics    Balance Overall balance assessment: Mild deficits observed, not formally tested                                         ADL either performed or assessed with clinical judgement   ADL Overall ADL's : Needs assistance/impaired     Grooming: Supervision/safety;Standing   Upper Body Bathing: Supervision/ safety;Sitting   Lower Body Bathing: Supervison/ safety;Sit to/from stand;Cueing for compensatory techniques;Cueing for back precautions Lower Body Bathing Details (indicate cue type and reason): reviewed safety seated and using figure 4 technique  Upper Body Dressing : Set up;Sitting Upper Body Dressing Details (indicate cue type and reason): donning zip down shirt with supervision after education of technique  Lower Body Dressing: Supervision/safety;Sit to/from stand;Cueing for compensatory techniques;Cueing for back precautions Lower Body Dressing Details (indicate cue type and reason): donning pants and underwear with supervision after education of techniques  Toilet Transfer: Supervision/safety;Ambulation;Comfort height toilet   Toileting- Clothing Manipulation and Hygiene: Supervision/safety;Sit to/from stand   Tub/ Shower Transfer: Tub transfer;Supervision/safety;Ambulation;Shower seat;Cueing for Contractorsafety Tub/Shower Transfer Details (indicate cue type and reason): simulated tub shower, agreeable to have supervision for bathing and transfer Functional mobility during ADLs: Supervision/safety General ADL Comments: educated on precautions, brace, safety and ADl compensatory techniques     Vision Baseline Vision/History: Wears glasses Wears Glasses: Reading only Patient Visual Report: No change from baseline Vision Assessment?: No apparent visual deficits     Perception     Praxis      Pertinent Vitals/Pain Pain Assessment:  0-10 Pain Score: 2  Pain Location: neck Pain Descriptors / Indicators: Discomfort;Sore Pain Intervention(s): Monitored during session     Hand Dominance Right   Extremity/Trunk Assessment Upper Extremity Assessment Upper Extremity Assessment: Overall WFL for tasks assessed(within cervical precautions, slight numbness R hand)   Lower Extremity Assessment Lower Extremity Assessment: Overall WFL for tasks assessed   Cervical / Trunk Assessment Cervical / Trunk Assessment: Other exceptions Cervical / Trunk Exceptions: s/p cervical surgery   Communication Communication Communication: No difficulties   Cognition Arousal/Alertness: Awake/alert Behavior During Therapy: WFL for tasks assessed/performed Overall Cognitive Status: Within Functional Limits for tasks assessed                                     General Comments       Exercises     Shoulder Instructions      Home Living Family/patient expects to be discharged to:: Private residence Living Arrangements: Alone Available Help at Discharge: Family;Available PRN/intermittently Type of Home: House Home Access: Stairs to enter Entrance Stairs-Number of Steps: 6 Entrance Stairs-Rails: (+ rail) Home Layout: One level;Laundry or work area in basement     Foot LockerBathroom Shower/Tub: Chief Strategy OfficerTub/shower unit   Bathroom Toilet: Handicapped height     Home Equipment: Environmental consultantWalker - 2 wheels;Cane - single point;Bedside commode;Shower seat;Hand held shower head   Additional Comments: reports son plans to assist initally       Prior Functioning/Environment Level of Independence: Independent        Comments: driving         OT Problem List: Decreased strength;Decreased safety awareness;Decreased knowledge of use of DME or AE;Decreased knowledge of precautions;Impaired UE functional use;Impaired sensation;Pain      OT Treatment/Interventions:      OT Goals(Current goals can be found in the care plan section) Acute  Rehab OT Goals Patient Stated Goal: home today  OT Goal Formulation: With patient  OT Frequency:     Barriers to D/C:            Co-evaluation              AM-PAC OT "6 Clicks" Daily Activity     Outcome Measure Help from another person eating meals?: None Help from another person taking care of personal grooming?: None Help from another person toileting, which includes using toliet, bedpan, or urinal?: None Help from another person bathing (including washing, rinsing, drying)?: None Help from another person to put on and taking off regular upper body clothing?: None Help from another person to put on and taking off regular lower body clothing?: None 6 Click Score: 24   End of Session Equipment Utilized During Treatment: Cervical collar Nurse Communication: Mobility status  Activity Tolerance: Patient tolerated treatment well Patient left: with call bell/phone within reach;Other (comment)(seated EOB)  OT Visit Diagnosis: Pain;Muscle weakness (generalized) (M62.81) Pain - part of body: (neck)                Time: [id number] OT Time Calculation (min): 21 min Charges:  OT General Charges $OT Visit: 1 Visit OT Evaluation $OT Eval Low Complexity: 1 Low  Chancy Milroyhristie S Cartier Washko, OT Acute Rehabilitation Services Pager [phone] Office [phone]   Chancy MilroyChristie S Beonka Amesquita 01/05/2019, 9:25 AM

## 2019-01-05 NOTE — Progress Notes (Signed)
Patient is discharged from room 3C08 at this time. Alert and in stable condition. IV site d/c'd and instructions read to patient and son with understanding verbalized. Left unit via wheelchair with all belongings at side. 

## 2019-01-05 NOTE — Discharge Summary (Signed)
Physician Discharge Summary  Patient ID: Alison Davis MRN: [id number] DOB/AGE: Aug 20, 1941 78 y.o.  Admit date: 01/04/2019 Discharge date: 01/05/2019  Admission Diagnoses:  Cervical radiculopathy  Discharge Diagnoses:  Same Active Problems:   Cervical radiculopathy   Discharged Condition: Stable  Hospital Course:  Alison Davis is a 78 y.o. female who was admitted for the below procedure. There were no post operative complications. At time of discharge, pain was well controlled, ambulating with Pt/OT, tolerating po, voiding normal. Ready for discharge.  Treatments: Surgery C4-5, C5-6, C6-7 anterior cervical discectomy with interbody fusion utilizing interbody peek cage, local harvested autograft, and anterior plate instrumentation  Discharge Exam: Blood pressure (!) 112/54, pulse 70, temperature 97.6 F (36.4 C), temperature source Oral, resp. rate 18, height 5\' 6"  (1.676 m), weight 67.1 kg, SpO2 96 %. Awake, alert, oriented Speech fluent, appropriate CN grossly intact 5/5 BUE/BLE with exception of very mild b/l grip strength Wound c/d/i, no swelling  Disposition: Discharge disposition: 01-Home or Self Care       Discharge Instructions    Call MD for:  difficulty breathing, headache or visual disturbances   Complete by:  As directed    Call MD for:  persistant dizziness or light-headedness   Complete by:  As directed    Call MD for:  redness, tenderness, or signs of infection (pain, swelling, redness, odor or green/yellow discharge around incision site)   Complete by:  As directed    Call MD for:  severe uncontrolled pain   Complete by:  As directed    Call MD for:  temperature >100.4   Complete by:  As directed    Diet general   Complete by:  As directed    Driving Restrictions   Complete by:  As directed    Do not drive until given clearance.   Increase activity slowly   Complete by:  As directed    Lifting restrictions   Complete by:  As directed    Do not lift anything >10lbs. Avoid bending and twisting in awkward positions. Avoid bending at the back.   May shower / Bathe   Complete by:  As directed    In 24 hours. Okay to wash wound with warm soapy water. Avoid scrubbing the wound. Pat dry.   Remove dressing in 24 hours   Complete by:  As directed      Allergies as of 01/05/2019   No Known Allergies     Medication List    STOP taking these medications   traMADol 50 MG tablet Commonly known as:  ULTRAM     TAKE these medications   Calcium-Magnesium-Zinc-D3 Tabs Take 1 tablet by mouth daily.   cyclobenzaprine 10 MG tablet Commonly known as:  FLEXERIL Take 1 tablet (10 mg total) by mouth 3 (three) times daily as needed for muscle spasms.   diclofenac sodium 1 % Gel Commonly known as:  VOLTAREN Apply 2 g topically daily as needed (pain).   estradiol 2 MG tablet Commonly known as:  ESTRACE Take 2 mg by mouth daily.   HYDROcodone-acetaminophen 10-325 MG tablet Commonly known as:  NORCO Take 1-2 tablets by mouth every 4 (four) hours as needed.   hydroxypropyl methylcellulose / hypromellose 2.5 % ophthalmic solution Commonly known as:  ISOPTO TEARS / GONIOVISC Place 1 drop into both eyes 3 (three) times daily as needed for dry eyes.   multivitamin with minerals Tabs tablet Take 2 tablets by mouth daily.   SALONPAS PAIN RELIEF PATCH EX Place 1  patch onto the skin daily as needed (pain).   Vitamin D3 50 MCG (2000 UT) capsule Take 4,000 Units by mouth daily.      Follow-up Information    Julio SicksPool, Henry, MD Follow up.   Specialty:  Neurosurgery Contact information: [address] [place] [address] [phone]           Signed: Alyson InglesCOSTELLA, Souleymane Saiki J 01/05/2019, 6:18 AM

## 2019-01-05 NOTE — Progress Notes (Signed)
  NEUROSURGERY PROGRESS NOTE   No issues overnight.  Endorses mild neck soreness Pre op pain resolved Had new right hand numbness after surgery that has since resolved Tolerating po. Ambulating well.    EXAM:  BP (!) 112/54 (BP Location: Left Arm)   Pulse 70   Temp 97.6 F (36.4 C) (Oral)   Resp 18   Ht 5\' 6"  (1.676 m)   Wt 67.1 kg   SpO2 96%   BMI 23.89 kg/m   Awake, alert, oriented  Speech fluent, appropriate  CN grossly intact  Grossly normal with exception of very mild grip strength weakness b/l Incision c/d/i, no swelling   PLAN Doing well Work with therapy today and then dc home

## 2019-01-05 NOTE — Discharge Instructions (Signed)

## 2019-01-07 ENCOUNTER — Encounter (HOSPITAL_COMMUNITY): Payer: Self-pay | Admitting: Neurosurgery

## 2019-11-26 DEATH — deceased

## 2020-05-01 IMAGING — RF DG CERVICAL SPINE 1V
1 series · 2 of 2 positions shown · non-contrast
Comparison: MRI 05/31/2017.

CLINICAL DATA: Cervical spine surgery.

EXAM:
DG C-ARM 61-120 MIN; DG CERVICAL SPINE - 1 VIEW

[Series 1: run · 2 of 2 slices shown]
[im 1/2]
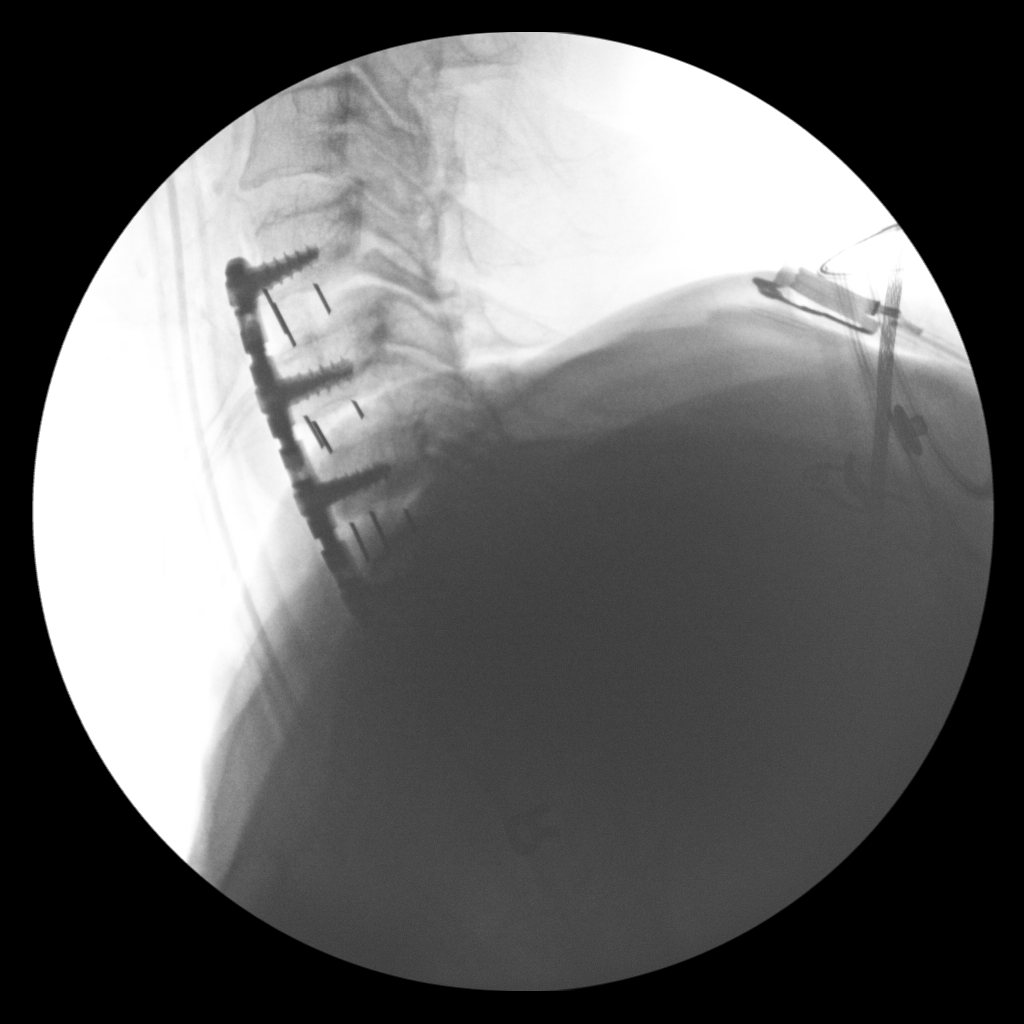
[im 2/2]
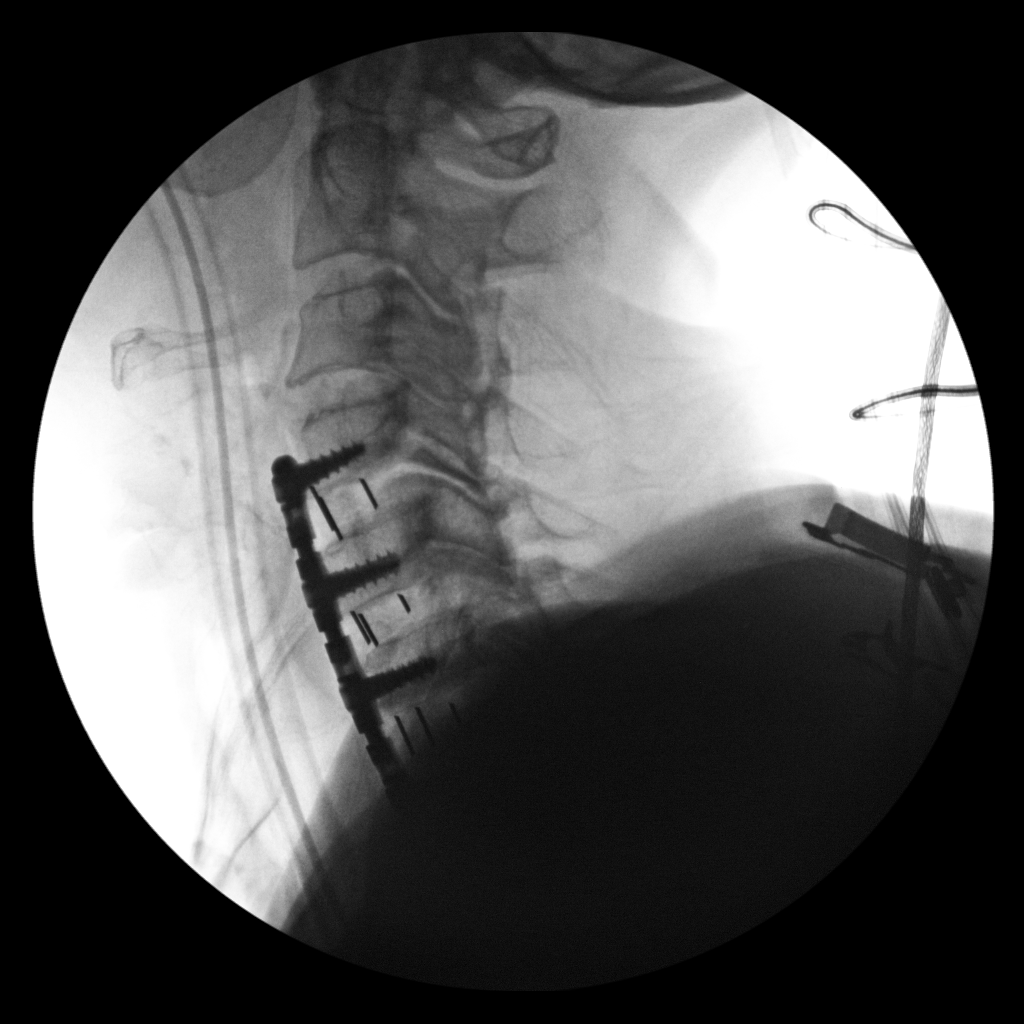

[2 of 2 positions shown; findings below may reference images not displayed]

FINDINGS: Cervical spine difficult to visualize. C4 through C7 anterior and
interbody fusion. Hardware intact were visualized. Anatomic
alignment.
IMPRESSION: C4 through C7 anterior and interbody fusion. Hardware intact.
Anatomic alignment.
